# Patient Record
Sex: Male | Born: 2000 | Race: White | Hispanic: Yes | Marital: Single | Smoking: Never smoker
Health system: Southern US, Community
[De-identification: ages and names within clinical notes are randomized; demographics above are authoritative.]

---
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
## 2024-07-27 ENCOUNTER — Encounter: Payer: Self-pay | Admitting: Emergency Medicine

## 2024-07-27 DIAGNOSIS — Z113 Encounter for screening for infections with a predominantly sexual mode of transmission: Secondary | ICD-10-CM
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
## 2024-07-27 NOTE — ED Provider Notes (Signed)
 [facility]    CSN: [id number] Arrival date & time: 07/27/24  1525      History   Chief Complaint Chief Complaint  Patient presents with   Exposure to STD    HPI Mark Perkins is a 23 y.o. male presents for STD screening.  He currently denies any symptoms including dysuria, penile discharge, testicular pain or swelling, fevers or flank pain.  He states his girlfriend is awaiting test results but does have a UTI.  No known exposure.  He does state that several months ago his girlfriend was positive for chlamydia and when he was tested at a urgent care in [place]  he states he was negative for everything.  No other concerns at this time.   Exposure to STD    History reviewed. No pertinent past medical history.  There are no active problems to display for this patient.   History reviewed. No pertinent surgical history.     Home Medications    Prior to Admission medications   Not on File    Family History No family history on file.  Social History Social History   Tobacco Use   Smoking status: Never  Substance Use Topics   Alcohol use: Never   Drug use: Never     Allergies   Patient has no known allergies.   Review of Systems Review of Systems  Genitourinary:        STD testing     Physical Exam Triage Vital Signs ED Triage Vitals  Encounter Vitals Group     BP 07/27/24 1545 (!) 139/94     Girls Systolic BP Percentile --      Girls Diastolic BP Percentile --      Boys Systolic BP Percentile --      Boys Diastolic BP Percentile --      Pulse Rate 07/27/24 1545 94     Resp 07/27/24 1545 15     Temp 07/27/24 1545 99 F (37.2 C)     Temp Source 07/27/24 1545 Oral     SpO2 07/27/24 1545 98 %     Weight --      Height --      Head Circumference --      Peak Flow --      Pain Score 07/27/24 1542 0     Pain Loc --      Pain Education --      Exclude from Growth Chart --    No data found.  Updated Vital Signs BP (!) 139/94 (BP  Location: Right Arm)   Pulse 94   Temp 99 F (37.2 C) (Oral)   Resp 15   SpO2 98%   Visual Acuity Right Eye Distance:   Left Eye Distance:   Bilateral Distance:    Right Eye Near:   Left Eye Near:    Bilateral Near:     Physical Exam Vitals and nursing note reviewed.  Constitutional:      Appearance: Normal appearance.  HENT:     Head: Normocephalic and atraumatic.  Eyes:     Pupils: Pupils are equal, round, and reactive to light.  Cardiovascular:     Rate and Rhythm: Normal rate.  Pulmonary:     Effort: Pulmonary effort is normal.  Skin:    General: Skin is warm and dry.  Neurological:     General: No focal deficit present.     Mental Status: He is alert and oriented to person, place, and time.  Psychiatric:  Mood and Affect: Mood normal.        Behavior: Behavior normal.      UC Treatments / Results  Labs (all labs ordered are listed, but only abnormal results are displayed) Labs Reviewed  CYTOLOGY, (ORAL, ANAL, URETHRAL) ANCILLARY ONLY    EKG   Radiology No results found.  Procedures Procedures (including critical care time)  Medications Ordered in UC Medications - No data to display  Initial Impression / Assessment and Plan / UC Course  I have reviewed the triage vital signs and the nursing notes.  Pertinent labs & imaging results that were available during my care of the patient were reviewed by me and considered in my medical decision making (see chart for details).     STD testing as ordered and will contact for any positive results.  Patient to follow-up as needed. Final Clinical Impressions(s) / UC Diagnoses   Final diagnoses:  Screening examination for STD (sexually transmitted disease)     Discharge Instructions      The clinic will contact you with results of the STD testing done today if positive.    ED Prescriptions   None    PDMP not reviewed this encounter.   Loreda Myla SAUNDERS, NP 07/27/24 1556

## 2024-07-27 NOTE — Discharge Instructions (Signed)
The clinic will contact you with results of the STD testing done today if positive.
